# Patient Record
Sex: Male | Born: 1970 | Race: White | Hispanic: No | Marital: Single | Smoking: Never smoker
Health system: Southern US, Community
[De-identification: ages and names within clinical notes are randomized; demographics above are authoritative.]

## PROBLEM LIST (undated history)

## (undated) DIAGNOSIS — J45909 Unspecified asthma, uncomplicated: Secondary | ICD-10-CM

## (undated) DIAGNOSIS — G4700 Insomnia, unspecified: Secondary | ICD-10-CM

## (undated) DIAGNOSIS — M25561 Pain in right knee: Secondary | ICD-10-CM

## (undated) DIAGNOSIS — R221 Localized swelling, mass and lump, neck: Secondary | ICD-10-CM

## (undated) DIAGNOSIS — F319 Bipolar disorder, unspecified: Secondary | ICD-10-CM

## (undated) HISTORY — DX: Bipolar disorder, unspecified: F31.9

## (undated) HISTORY — DX: Insomnia, unspecified: G47.00

## (undated) HISTORY — DX: Localized swelling, mass and lump, neck: R22.1

## (undated) HISTORY — PX: KNEE SURGERY: SHX244

## (undated) HISTORY — DX: Pain in right knee: M25.561

---
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
## 1995-07-15 HISTORY — PX: KNEE ARTHROSCOPY: SUR90

## 2010-07-14 DIAGNOSIS — R3129 Other microscopic hematuria: Secondary | ICD-10-CM

## 2010-07-14 DIAGNOSIS — G8929 Other chronic pain: Secondary | ICD-10-CM

## 2010-07-14 HISTORY — DX: Other microscopic hematuria: R31.29

## 2010-07-14 HISTORY — DX: Other chronic pain: G89.29

## 2012-01-12 DIAGNOSIS — S2239XA Fracture of one rib, unspecified side, initial encounter for closed fracture: Secondary | ICD-10-CM

## 2012-01-12 HISTORY — DX: Fracture of one rib, unspecified side, initial encounter for closed fracture: S22.39XA

## 2012-01-28 ENCOUNTER — Encounter (HOSPITAL_COMMUNITY): Payer: Self-pay | Admitting: *Deleted

## 2012-01-28 ENCOUNTER — Emergency Department (HOSPITAL_COMMUNITY): Payer: Commercial Managed Care - PPO

## 2012-01-28 ENCOUNTER — Emergency Department (HOSPITAL_COMMUNITY)
Admission: EM | Admit: 2012-01-28 | Discharge: 2012-01-28 | Disposition: A | Discharge status: Home / Self Care | Payer: Commercial Managed Care - PPO | Attending: Emergency Medicine | Admitting: Emergency Medicine

## 2012-01-28 DIAGNOSIS — S2249XA Multiple fractures of ribs, unspecified side, initial encounter for closed fracture: Secondary | ICD-10-CM | POA: Insufficient documentation

## 2012-01-28 DIAGNOSIS — S20219A Contusion of unspecified front wall of thorax, initial encounter: Secondary | ICD-10-CM | POA: Insufficient documentation

## 2012-01-28 DIAGNOSIS — W1801XA Striking against sports equipment with subsequent fall, initial encounter: Secondary | ICD-10-CM | POA: Insufficient documentation

## 2012-01-28 DIAGNOSIS — Y9366 Activity, soccer: Secondary | ICD-10-CM | POA: Insufficient documentation

## 2012-01-28 DIAGNOSIS — S2239XA Fracture of one rib, unspecified side, initial encounter for closed fracture: Secondary | ICD-10-CM

## 2012-01-28 HISTORY — DX: Unspecified asthma, uncomplicated: J45.909

## 2012-01-28 MED ORDER — HYDROCODONE-ACETAMINOPHEN 5-500 MG PO TABS: 1.0000 | ORAL_TABLET | Freq: Four times a day (QID) | ORAL | Status: AC | PRN

## 2012-01-28 MED ORDER — HYDROCODONE-ACETAMINOPHEN 5-325 MG PO TABS: 2.0000 | ORAL_TABLET | Freq: Once | ORAL | Status: DC

## 2012-01-28 MED ORDER — ONDANSETRON HCL 4 MG PO TABS: 4.0000 mg | ORAL_TABLET | Freq: Four times a day (QID) | ORAL | Status: AC

## 2012-01-28 MED ORDER — CYCLOBENZAPRINE HCL 10 MG PO TABS: 10.0000 mg | ORAL_TABLET | Freq: Two times a day (BID) | ORAL | Status: AC | PRN

## 2012-01-28 MED ORDER — IBUPROFEN 800 MG PO TABS: 800.0000 mg | ORAL_TABLET | Freq: Three times a day (TID) | ORAL | Status: AC

## 2012-01-28 NOTE — ED Notes (Signed)
Pt states he was hit in the left ribs on Monday playing soccer. Pt has pain that varies sometimes sharp and moves

## 2012-01-28 NOTE — ED Notes (Signed)
Pt elects not to take meds for discomfort, states the took Tramadol prior to coming to the ED

## 2012-01-28 NOTE — ED Provider Notes (Signed)
History     CSN: [id number]  Arrival date & time 01/28/12  0936   First MD Initiated Contact with Patient 01/28/12 [id number]      No chief complaint on file.   (Consider location/radiation/quality/duration/timing/severity/associated sxs/prior treatment) HPI  Patient presents to emergency department complaining of left chest wall/rib injury that happened 2 days ago while playing soccer. Patient states that he was running on the field collision with an opponent the opponent striking into his left chest wall. Patient states he fell to the ground immediately had some pain in which the sideline rested for a few minutes. However he states he was able to go back into the game and finish area and patient states that he woke the next day, yesterday, with more severe pain in his been having severe pain since then. Patient taken ibuprofen and Ultram with little to no relief of pain. He states pain is aggravated by movement, cough, deep breathing or even touch. He denies any difficulty breathing stating "justified take a really big breath it hurts." He denies any abdominal pain, hitting head or loss of consciousness, neck pain or back pain, or any other additional injury.  No past medical history on file.  No past surgical history on file.  No family history on file.  History  Substance Use Topics  . Smoking status: Not on file  . Smokeless tobacco: Not on file  . Alcohol Use: Not on file      Review of Systems  All other systems reviewed and are negative.    Allergies  Review of patient's allergies indicates no known allergies.  Home Medications   Current Outpatient Rx  Name Route Sig Dispense Refill  . BUPROPION HCL ER (SR) 100 MG PO TB12 Oral Take 200 mg by mouth every morning.    . IBUPROFEN 200 MG PO TABS Oral Take 200 mg by mouth every 6 (six) hours as needed. pain    . LAMOTRIGINE 200 MG PO TABS Oral Take 200 mg by mouth daily.    . TRAMADOL HCL 50 MG PO TABS Oral Take 50 mg by  mouth every 6 (six) hours as needed. pain      There were no vitals taken for this visit.  Physical Exam  Nursing note and vitals reviewed. Constitutional: He is oriented to person, place, and time. He appears well-developed and well-nourished. No distress.  HENT:  Head: Normocephalic and atraumatic.  Eyes: Conjunctivae are normal.  Neck: Normal range of motion. Neck supple.  Cardiovascular: Normal rate, regular rhythm, normal heart sounds and intact distal pulses.  Exam reveals no gallop and no friction rub.   No murmur heard. Pulmonary/Chest: Effort normal and breath sounds normal. No respiratory distress. He has no wheezes. He has no rales. He exhibits tenderness.       Tenderness to palpation of left lower anterior ribs without chest wall bruising, crepitus, or step off. Pain in left lateral lower ribs with deep inhalation however breath sounds equal throughout all lung fields bilaterally.  Abdominal: Soft. Bowel sounds are normal. He exhibits no distension and no mass. There is no tenderness. There is no rebound and no guarding.  Musculoskeletal: Normal range of motion.  Neurological: He is alert and oriented to person, place, and time.  Skin: Skin is warm and dry. No rash noted. He is not diaphoretic. No erythema.  Psychiatric: He has a normal mood and affect.    ED Course  Procedures (including critical care time)  By mouth hydrocodone acetaminophen.  Patient is made aware of importance of incentive spirometry but due to financial reasons does not want to be charged for actual device however states he will actively take deep breathing as instructed by nursing.   Labs Reviewed - No data to display Dg Ribs Unilateral W/chest Left  01/28/2012  *RADIOLOGY REPORT*  Clinical Data: Injured playing soccer with trauma to the chest  LEFT RIBS AND CHEST - 3+ VIEW  Comparison: None.  Findings: The lungs appear clear.  No pneumothorax is noted. Mediastinal contours are normal.  The heart is  within normal limits in size.  Left rib detail films do show fractures of the left lateral eighth rib and anterior left tenth rib.  IMPRESSION:    Fractures of the left lateral eighth and anterior left tenth rib. No pneumothorax.  Original Report Authenticated By: Juline Patch, M.D.     1. Rib fractures   2. Chest wall contusion       MDM  Non displaced rib fxs of 8th and 10th ribs however abdomen completely nontender, noting no TTP over LUQ of abdomen. VSS. Patient ambulating without difficulty. Good lung exam and patient able to take deep breaths.         [place], [place] 01/28/12 1112

## 2012-01-29 NOTE — ED Provider Notes (Signed)
Medical screening examination/treatment/procedure(s) were performed by non-physician practitioner and as supervising physician I was immediately available for consultation/collaboration.   Teofil Maniaci R Jakwan Sally, MD 01/29/12 0750 

## 2017-04-13 HISTORY — PX: OTHER SURGICAL HISTORY: SHX169

## 2017-07-14 HISTORY — PX: OTHER SURGICAL HISTORY: SHX169

## 2018-06-14 ENCOUNTER — Other Ambulatory Visit (HOSPITAL_COMMUNITY): Payer: Self-pay | Admitting: Neurosurgery

## 2018-06-14 ENCOUNTER — Ambulatory Visit (HOSPITAL_COMMUNITY)
Admission: RE | Admit: 2018-06-14 | Discharge: 2018-06-14 | Disposition: A | Discharge status: Home / Self Care | Payer: Managed Care, Other (non HMO) | Source: Ambulatory Visit | Attending: Neurosurgery | Admitting: Neurosurgery

## 2018-06-14 DIAGNOSIS — M4316 Spondylolisthesis, lumbar region: Secondary | ICD-10-CM | POA: Insufficient documentation

## 2018-06-14 DIAGNOSIS — M5416 Radiculopathy, lumbar region: Secondary | ICD-10-CM | POA: Insufficient documentation

## 2018-06-14 DIAGNOSIS — M5126 Other intervertebral disc displacement, lumbar region: Secondary | ICD-10-CM | POA: Insufficient documentation

## 2018-06-14 MED ORDER — GADOBUTROL 1 MMOL/ML IV SOLN
7.0000 mL | Freq: Once | INTRAVENOUS | Status: AC | PRN
  Administered 2018-06-14: 7 mL via INTRAVENOUS

## 2019-05-05 DIAGNOSIS — L821 Other seborrheic keratosis: Secondary | ICD-10-CM | POA: Diagnosis not present

## 2019-05-05 DIAGNOSIS — L718 Other rosacea: Secondary | ICD-10-CM | POA: Diagnosis not present

## 2019-05-05 DIAGNOSIS — D225 Melanocytic nevi of trunk: Secondary | ICD-10-CM | POA: Diagnosis not present

## 2019-05-05 DIAGNOSIS — L573 Poikiloderma of Civatte: Secondary | ICD-10-CM | POA: Diagnosis not present

## 2019-09-22 DIAGNOSIS — Z20822 Contact with and (suspected) exposure to covid-19: Secondary | ICD-10-CM | POA: Diagnosis not present

## 2019-11-24 DIAGNOSIS — Z0000 Encounter for general adult medical examination without abnormal findings: Secondary | ICD-10-CM | POA: Diagnosis not present

## 2019-11-24 DIAGNOSIS — Z125 Encounter for screening for malignant neoplasm of prostate: Secondary | ICD-10-CM | POA: Diagnosis not present

## 2019-11-24 DIAGNOSIS — Z79899 Other long term (current) drug therapy: Secondary | ICD-10-CM | POA: Diagnosis not present

## 2019-11-25 DIAGNOSIS — F319 Bipolar disorder, unspecified: Secondary | ICD-10-CM | POA: Diagnosis not present

## 2019-11-25 DIAGNOSIS — Z0000 Encounter for general adult medical examination without abnormal findings: Secondary | ICD-10-CM | POA: Diagnosis not present

## 2019-11-25 DIAGNOSIS — I10 Essential (primary) hypertension: Secondary | ICD-10-CM | POA: Diagnosis not present

## 2019-11-25 DIAGNOSIS — M545 Low back pain: Secondary | ICD-10-CM | POA: Diagnosis not present

## 2019-12-05 DIAGNOSIS — F331 Major depressive disorder, recurrent, moderate: Secondary | ICD-10-CM | POA: Diagnosis not present

## 2020-01-03 DIAGNOSIS — F331 Major depressive disorder, recurrent, moderate: Secondary | ICD-10-CM | POA: Diagnosis not present

## 2020-02-08 DIAGNOSIS — F331 Major depressive disorder, recurrent, moderate: Secondary | ICD-10-CM | POA: Diagnosis not present

## 2020-02-22 DIAGNOSIS — F331 Major depressive disorder, recurrent, moderate: Secondary | ICD-10-CM | POA: Diagnosis not present

## 2020-03-07 DIAGNOSIS — F331 Major depressive disorder, recurrent, moderate: Secondary | ICD-10-CM | POA: Diagnosis not present

## 2020-04-04 DIAGNOSIS — F331 Major depressive disorder, recurrent, moderate: Secondary | ICD-10-CM | POA: Diagnosis not present

## 2020-05-17 DIAGNOSIS — F331 Major depressive disorder, recurrent, moderate: Secondary | ICD-10-CM | POA: Diagnosis not present

## 2020-06-05 DIAGNOSIS — F331 Major depressive disorder, recurrent, moderate: Secondary | ICD-10-CM | POA: Diagnosis not present

## 2020-08-10 DIAGNOSIS — G518 Other disorders of facial nerve: Secondary | ICD-10-CM | POA: Diagnosis not present

## 2020-12-20 DIAGNOSIS — F4323 Adjustment disorder with mixed anxiety and depressed mood: Secondary | ICD-10-CM | POA: Diagnosis not present

## 2021-01-07 DIAGNOSIS — L573 Poikiloderma of Civatte: Secondary | ICD-10-CM | POA: Diagnosis not present

## 2021-01-07 DIAGNOSIS — D225 Melanocytic nevi of trunk: Secondary | ICD-10-CM | POA: Diagnosis not present

## 2021-01-07 DIAGNOSIS — L821 Other seborrheic keratosis: Secondary | ICD-10-CM | POA: Diagnosis not present

## 2021-01-07 DIAGNOSIS — D1801 Hemangioma of skin and subcutaneous tissue: Secondary | ICD-10-CM | POA: Diagnosis not present

## 2021-01-10 DIAGNOSIS — F4323 Adjustment disorder with mixed anxiety and depressed mood: Secondary | ICD-10-CM | POA: Diagnosis not present

## 2021-01-16 DIAGNOSIS — F4323 Adjustment disorder with mixed anxiety and depressed mood: Secondary | ICD-10-CM | POA: Diagnosis not present

## 2021-02-01 DIAGNOSIS — F4323 Adjustment disorder with mixed anxiety and depressed mood: Secondary | ICD-10-CM | POA: Diagnosis not present

## 2021-03-27 DIAGNOSIS — F4323 Adjustment disorder with mixed anxiety and depressed mood: Secondary | ICD-10-CM | POA: Diagnosis not present

## 2021-05-20 DIAGNOSIS — F4323 Adjustment disorder with mixed anxiety and depressed mood: Secondary | ICD-10-CM | POA: Diagnosis not present

## 2021-06-11 DIAGNOSIS — F4323 Adjustment disorder with mixed anxiety and depressed mood: Secondary | ICD-10-CM | POA: Diagnosis not present

## 2021-06-18 DIAGNOSIS — Z008 Encounter for other general examination: Secondary | ICD-10-CM | POA: Diagnosis not present

## 2021-10-21 ENCOUNTER — Ambulatory Visit (HOSPITAL_COMMUNITY)
Admission: RE | Admit: 2021-10-21 | Discharge: 2021-10-21 | Disposition: A | Discharge status: Home / Self Care | Payer: BC Managed Care – PPO | Source: Ambulatory Visit | Attending: Internal Medicine | Admitting: Internal Medicine

## 2021-10-21 ENCOUNTER — Other Ambulatory Visit (HOSPITAL_COMMUNITY): Payer: Self-pay | Admitting: Internal Medicine

## 2021-10-21 ENCOUNTER — Other Ambulatory Visit: Payer: Self-pay | Admitting: Internal Medicine

## 2021-10-21 DIAGNOSIS — G518 Other disorders of facial nerve: Secondary | ICD-10-CM | POA: Insufficient documentation

## 2021-10-21 DIAGNOSIS — G500 Trigeminal neuralgia: Secondary | ICD-10-CM | POA: Diagnosis not present

## 2021-10-21 DIAGNOSIS — G519 Disorder of facial nerve, unspecified: Secondary | ICD-10-CM | POA: Diagnosis not present

## 2021-10-21 MED ORDER — GADOBUTROL 1 MMOL/ML IV SOLN
7.5000 mL | Freq: Once | INTRAVENOUS | Status: AC | PRN
  Administered 2021-10-21: 7.5 mL via INTRAVENOUS

## 2021-11-28 ENCOUNTER — Encounter: Payer: Self-pay | Admitting: *Deleted

## 2021-11-28 NOTE — Progress Notes (Signed)
Notes used for referral: Jorge Orozco at [facility].

## 2021-12-02 ENCOUNTER — Ambulatory Visit: Payer: BC Managed Care – PPO | Admitting: Neurology

## 2021-12-12 DIAGNOSIS — L718 Other rosacea: Secondary | ICD-10-CM | POA: Diagnosis not present

## 2021-12-17 ENCOUNTER — Ambulatory Visit: Payer: BC Managed Care – PPO | Admitting: Neurology

## 2021-12-18 ENCOUNTER — Encounter: Payer: Self-pay | Admitting: Neurology

## 2022-07-16 DIAGNOSIS — F331 Major depressive disorder, recurrent, moderate: Secondary | ICD-10-CM | POA: Diagnosis not present

## 2022-07-16 DIAGNOSIS — F411 Generalized anxiety disorder: Secondary | ICD-10-CM | POA: Diagnosis not present

## 2022-08-21 DIAGNOSIS — F411 Generalized anxiety disorder: Secondary | ICD-10-CM | POA: Diagnosis not present

## 2022-08-21 DIAGNOSIS — F331 Major depressive disorder, recurrent, moderate: Secondary | ICD-10-CM | POA: Diagnosis not present

## 2022-12-17 DIAGNOSIS — F319 Bipolar disorder, unspecified: Secondary | ICD-10-CM | POA: Diagnosis not present

## 2022-12-17 DIAGNOSIS — F411 Generalized anxiety disorder: Secondary | ICD-10-CM | POA: Diagnosis not present

## 2022-12-17 DIAGNOSIS — F332 Major depressive disorder, recurrent severe without psychotic features: Secondary | ICD-10-CM | POA: Diagnosis not present

## 2022-12-26 DIAGNOSIS — J029 Acute pharyngitis, unspecified: Secondary | ICD-10-CM | POA: Diagnosis not present

## 2023-03-09 DIAGNOSIS — F332 Major depressive disorder, recurrent severe without psychotic features: Secondary | ICD-10-CM | POA: Diagnosis not present

## 2023-04-07 DIAGNOSIS — F319 Bipolar disorder, unspecified: Secondary | ICD-10-CM | POA: Diagnosis not present

## 2023-07-10 ENCOUNTER — Encounter: Payer: Self-pay | Admitting: Behavioral Health

## 2023-07-10 ENCOUNTER — Ambulatory Visit (INDEPENDENT_AMBULATORY_CARE_PROVIDER_SITE_OTHER): Payer: 59 | Admitting: Behavioral Health

## 2023-07-10 ENCOUNTER — Telehealth: Payer: Self-pay | Admitting: Behavioral Health

## 2023-07-10 VITALS — BP 143/94 | HR 82 | Ht 67.5 in | Wt 164.0 lb

## 2023-07-10 DIAGNOSIS — F330 Major depressive disorder, recurrent, mild: Secondary | ICD-10-CM | POA: Diagnosis not present

## 2023-07-10 DIAGNOSIS — F411 Generalized anxiety disorder: Secondary | ICD-10-CM | POA: Diagnosis not present

## 2023-07-10 DIAGNOSIS — Z9151 Personal history of suicidal behavior: Secondary | ICD-10-CM | POA: Diagnosis not present

## 2023-07-10 DIAGNOSIS — F39 Unspecified mood [affective] disorder: Secondary | ICD-10-CM | POA: Diagnosis not present

## 2023-07-10 MED ORDER — DULOXETINE HCL 40 MG PO CPEP: 40.0000 mg | ORAL_CAPSULE | Freq: Every day | ORAL | 2 refills | Status: DC

## 2023-07-10 MED ORDER — LAMOTRIGINE 200 MG PO TABS: 200.0000 mg | ORAL_TABLET | Freq: Every day | ORAL | 3 refills | Status: DC

## 2023-07-10 NOTE — Progress Notes (Signed)
[facility] MD/PA/NP Initial Note  07/10/2023 12:27 PM Jorge Orozco  MRN:  [id number]  Chief Complaint:  Chief Complaint   Anxiety; Depression; Establish Care; Patient Education; Medication Refill     HPI:   "Jorge Orozco", 52 year old patient presents to this office for initial visit and to establish care.  Collateral information should be considered reliable.  Patient states that he is struggled with anxiety and depression since he was in his late 6s.  During this initial time.  The provider who he cannot remember her name diagnosed him with bipolar.  He has never been assured that this is an accurate diagnosis because he cannot recall any past periods of mania or hypomania.  Prior to this appointment he was seeing Dr. Elsie Saas, psychiatrist.  Says that he was placed on Cymbalta in late summer.  He continues with Lamictal.  Says that this combination so far has been working well for him and that he is experiencing low levels of anxiety and depression.  He is here today to get a different opinion on his condition.  His PHQ-9 was negative.  His MDQ was grossly negative with none of the criteria on marked yes.  He numerically rates his depression at 0/10, and anxiety at 3/10.  He says that he is sleeping 7 to 8 hours per night.  He does endorse daily EtOH intake average of 2-3 alcoholic beverages per day.  For now he is requesting no medication changes but needs refills.  He does have a past history of 1 suicide attempt approximately 1 year ago.  Says that he combined heavy EtOH along with about 15 Xanax.  Says that he never went to the ER but did seek out psychotherapy.  He is currently seeing Wenda Overland for psychotherapy.  He denies any history of mania, no psychosis, no auditory or visual hallucinations.  Denies any current SI or HI.  Patient states that he feels safe and verbally contracted for safety with this Clinical research associate.  Past psychiatric medication trials: Zoloft Lexapro Trintellix- Did not  like the way it made him feel Seroquel Klonopin Xanax Gabapentin Ambien Lunesta Lamictal Wellbutrin Cymbalta- current    Visit Diagnosis:    ICD-10-CM   1. Mild episode of recurrent major depressive disorder (HCC)  F33.0 DULoxetine HCl 40 MG CPEP    lamoTRIgine (LAMICTAL) 200 MG tablet    2. Generalized anxiety disorder  F41.1 DULoxetine HCl 40 MG CPEP    3. Unspecified mood (affective) disorder (HCC)  F39 lamoTRIgine (LAMICTAL) 200 MG tablet    4. Hx of suicide attempt  Z91.51       Past Psychiatric History: Anxiety, MDD, Bipolar  Past Medical History:  Past Medical History:  Diagnosis Date   Asthma    Bipolar disorder (HCC)    Chronic knee pain 2012   right; baker's cyst   Insomnia    hard to get back asleep, sleep test in 2012 reportedly normal   Lump on neck    resolved   Microscopic hematuria 2012   workup suggested a passed kidney stone   Rib fracture 01/2012   July 2013 left 8th and 10th rib fractures from soccer accident, no penumothorax   Right knee pain    mild pain with exercise    Past Surgical History:  Procedure Laterality Date   KNEE ARTHROSCOPY Left 1997   KNEE SURGERY     lumbar 4-5 disc surgery  04/2017   Dr Venetia Maxon   lumbar spine ESI  2019   skull  surgery  1971    Family Psychiatric History: see chart  Family History:  Family History  Problem Relation Age of Onset   Depression Mother    Anxiety disorder Mother    Hypertension Mother    Sleep apnea Mother    Leukemia Father    CAD Father    Prostate cancer Father    Anxiety disorder Sister    Alcohol abuse Maternal Grandfather    Bipolar disorder Maternal Grandmother    Alcohol abuse Maternal Grandmother     Social History:  Social History   Socioeconomic History   Marital status: Single    Spouse name: Not on file   Number of children: 0   Years of education: 16   Highest education level: Bachelor's degree (e.g., BA, AB, BS)  Occupational History   Occupation: [employer]  Tobacco Use   Smoking status: Never   Smokeless tobacco: Not on file  Vaping Use   Vaping status: Never Used  Substance and Sexual Activity   Alcohol use: Yes    Comment: Two drinks per day, glass of wine or coctail   Drug use: No   Sexual activity: Yes  Other Topics Concern   Not on file  Social History Narrative   Living in [place] alone. Enjoys fishing, and boating in free time.    Social Drivers of Corporate investment banker Strain: Not on file  Food Insecurity: Not on file  Transportation Needs: Not on file  Physical Activity: Not on file  Stress: Not on file  Social Connections: Not on file    Allergies: No Known Allergies  Metabolic Disorder Labs: No results found for: "HGBA1C", "MPG" No results found for: "PROLACTIN" No results found for: "CHOL", "TRIG", "HDL", "CHOLHDL", "VLDL", "LDLCALC" No results found for: "TSH"  Therapeutic Level Labs: No results found for: "LITHIUM" No results found for: "VALPROATE" No results found for: "CBMZ"  Current Medications: Current Outpatient Medications  Medication Sig Dispense Refill   DULoxetine HCl 40 MG CPEP Take 1 capsule (40 mg total) by mouth daily after breakfast. 30 capsule 2   buPROPion (WELLBUTRIN XL) 300 MG 24 hr tablet Take 300 mg by mouth daily. (Patient not taking: Reported on 07/10/2023)     lamoTRIgine (LAMICTAL) 200 MG tablet Take 1 tablet (200 mg total) by mouth daily. 30 tablet 3   traMADol (ULTRAM) 50 MG tablet Take 50 mg by mouth every 6 (six) hours as needed. pain     No current facility-administered medications for this visit.    Medication Side Effects: none  Orders placed this visit:  No orders of the defined types were placed in this encounter.   Psychiatric Specialty Exam:  Review of Systems  Constitutional: Negative.   Allergic/Immunologic: Negative.   Neurological: Negative.   Psychiatric/Behavioral: Negative.      Blood pressure (!) 143/94,  pulse 82, height 5' 7.5" (1.715 m), weight 164 lb (74.4 kg).Body mass index is 25.31 kg/m.  General Appearance: Casual, Neat, and Well Groomed  Eye Contact:  Good  Speech:  Clear and Coherent  Volume:  Normal  Mood:  NA  Affect:  Appropriate  Thought Process:  Coherent  Orientation:  Full (Time, Place, and Person)  Thought Content: Logical   Suicidal Thoughts:  No  Homicidal Thoughts:  No  Memory:  WNL  Judgement:  Good  Insight:  Good  Psychomotor Activity:  Normal  Concentration:  Concentration: Good  Recall:  Good  Fund  of Knowledge: Good  Language: Good  Assets:  Desire for Improvement  ADL's:  Intact  Cognition: WNL  Prognosis:  Good   Screenings:  PHQ2-9    Flowsheet Row Office Visit from 07/10/2023 in [facility]  PHQ-2 Total Score 1       Receiving Psychotherapy: No   Treatment Plan/Recommendations:   Greater than 50% of  60 min face to face time with patient was spent on counseling and coordination of care. We discussed his long history with anxiety and depression stemming back to his late 72s.  We talked about previous care with other providers over the years and plan of care.  Reviewed prior psychotropic medication trials and current stability.  He is currently stable and feels like that he is in a good place.  Requesting no medication changes this visit.  We agreed today to: Will continue Cymbalta 40 mg daily after breakfast To continue Lamictal 200 mg daily To follow-up in 6 weeks to reassess via video appointment Will report worsening symptoms or side effects promptly Provided emergency contact information Reviewed PDMP      Joan Flores, NP

## 2023-07-20 MED ORDER — DULOXETINE HCL 20 MG PO CPEP: 40.0000 mg | ORAL_CAPSULE | Freq: Every day | ORAL | 2 refills | Status: DC

## 2023-07-20 NOTE — Telephone Encounter (Signed)
 Jorge Orozco called at 9:37 to check status of authorizing his Cymbalta.  He is completely out. He doesn't understand why insurance won't cover this medication.  It always has.  He is new to [place] and probably just needs a new PA.

## 2023-07-20 NOTE — Telephone Encounter (Signed)
 LVM to [facility]

## 2023-07-20 NOTE — Telephone Encounter (Signed)
 Arlys John sent in 20 mg x 2. LVM to [facility] to see how patient was taking previously.

## 2023-07-20 NOTE — Telephone Encounter (Signed)
 Rx has been filled for 20 mg x 2.

## 2023-08-12 ENCOUNTER — Other Ambulatory Visit: Payer: Self-pay | Admitting: Behavioral Health

## 2023-08-12 DIAGNOSIS — F39 Unspecified mood [affective] disorder: Secondary | ICD-10-CM

## 2023-08-12 DIAGNOSIS — F411 Generalized anxiety disorder: Secondary | ICD-10-CM

## 2023-08-12 DIAGNOSIS — F330 Major depressive disorder, recurrent, mild: Secondary | ICD-10-CM

## 2023-08-25 ENCOUNTER — Telehealth (INDEPENDENT_AMBULATORY_CARE_PROVIDER_SITE_OTHER): Payer: 59 | Admitting: Behavioral Health

## 2023-08-25 ENCOUNTER — Encounter: Payer: Self-pay | Admitting: Behavioral Health

## 2023-08-25 DIAGNOSIS — F39 Unspecified mood [affective] disorder: Secondary | ICD-10-CM | POA: Diagnosis not present

## 2023-08-25 DIAGNOSIS — F330 Major depressive disorder, recurrent, mild: Secondary | ICD-10-CM | POA: Diagnosis not present

## 2023-08-25 DIAGNOSIS — F411 Generalized anxiety disorder: Secondary | ICD-10-CM | POA: Diagnosis not present

## 2023-08-25 NOTE — Progress Notes (Signed)
MARSALIS Orozco [id number] 16-Dec-1970 53 y.o.  Virtual Visit via Video Note  I connected with pt @ on 08/25/23 at  1:30 PM EST by a video enabled telemedicine application and verified that I am speaking with the correct person using two identifiers.   I discussed the limitations of evaluation and management by telemedicine and the availability of in person appointments. The patient expressed understanding and agreed to proceed.  I discussed the assessment and treatment plan with the patient. The patient was provided an opportunity to ask questions and all were answered. The patient agreed with the plan and demonstrated an understanding of the instructions.   The patient was advised to call back or seek an in-person evaluation if the symptoms worsen or if the condition fails to improve as anticipated.  I provided 20 minutes of non-face-to-face time during this encounter.  The patient was located at home.  The provider was located at [facility].   Joan Flores, NP   Subjective:   Patient ID:  ROSALIE Orozco is a 53 y.o. (DOB 06/22/1971) male.  Chief Complaint:  Chief Complaint  Patient presents with   Anxiety   Follow-up   Patient Education   Medication Refill    HPI  "Jorge Orozco", 53 year old patient presents to this office for follow up and medication management. Collateral information should be considered reliable. Says he is doing very good right now.   He numerically rates his depression at 0/10, and anxiety at 3/10.  He says that he is sleeping 7 to 8 hours per night.  Says that he has been doing very well since last visit. 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  His drinking has been more controlled.  He does not want to make any adjustments to his medication this visit.   He is currently seeing Wenda Overland for psychotherapy.  He denies any history of mania, no psychosis, no auditory or visual hallucinations.  Denies any current SI or HI.   Patient states that he feels safe and verbally contracted for safety with this Clinical research associate.   Past psychiatric medication trials: Zoloft Lexapro Trintellix- Did not like the way it made him feel Seroquel Klonopin Xanax Gabapentin Ambien Lunesta Lamictal Wellbutrin Cymbalta- current    Review of Systems:  Review of Systems  Constitutional: Negative.   Neurological: Negative.   Psychiatric/Behavioral: Negative.      Medications: I have reviewed the patient's current medications.  Current Outpatient Medications  Medication Sig Dispense Refill   DULoxetine (CYMBALTA) 20 MG capsule Take 2 capsules (40 mg total) by mouth daily. 60 capsule 2   lamoTRIgine (LAMICTAL) 200 MG tablet Take 1 tablet (200 mg total) by mouth daily. 30 tablet 3   No current facility-administered medications for this visit.    Medication Side Effects: None  Allergies: No Known Allergies  Past Medical History:  Diagnosis Date   Asthma    Bipolar disorder (HCC)    Chronic knee pain 2012   right; baker's cyst   Insomnia    hard to get back asleep, sleep test in 2012 reportedly normal   Lump on neck    resolved   Microscopic hematuria 2012   workup suggested a passed kidney stone   Rib fracture 01/2012   July 2013 left 8th and 10th rib fractures from soccer accident, no penumothorax   Right knee pain    mild pain with exercise    Family History  Problem Relation Age of  Onset   Depression Mother    Anxiety disorder Mother    Hypertension Mother    Sleep apnea Mother    Leukemia Father    CAD Father    Prostate cancer Father    Anxiety disorder Sister    Alcohol abuse Maternal Grandfather    Bipolar disorder Maternal Grandmother    Alcohol abuse Maternal Grandmother     Social History   Socioeconomic History   Marital status: Single    Spouse name: Not on file   Number of children: 0   Years of education: 16   Highest education level: Bachelor's degree (e.g., BA, AB, BS)   Occupational History   Occupation: [employer]  Tobacco Use   Smoking status: Never   Smokeless tobacco: Not on file  Vaping Use   Vaping status: Never Used  Substance and Sexual Activity   Alcohol use: Yes    Comment: Two drinks per day, glass of wine or coctail   Drug use: No   Sexual activity: Yes  Other Topics Concern   Not on file  Social History Narrative   Living in [place] alone. Enjoys fishing, and boating in free time.    Social Drivers of Corporate investment banker Strain: Not on file  Food Insecurity: Not on file  Transportation Needs: Not on file  Physical Activity: Not on file  Stress: Not on file  Social Connections: Not on file  Intimate Partner Violence: Not on file    Past Medical History, Surgical history, Social history, and Family history were reviewed and updated as appropriate.   Please see review of systems for further details on the patient's review from today.   Objective:   Physical Exam:  There were no vitals taken for this visit.  Physical Exam Constitutional:      General: He is not in acute distress.    Appearance: Normal appearance.  Neurological:     Mental Status: He is alert and oriented to person, place, and time.     Gait: Gait normal.  Psychiatric:        Attention and Perception: Attention and perception normal. He does not perceive auditory or visual hallucinations.        Mood and Affect: Mood and affect normal. Mood is not anxious or depressed. Affect is not labile.        Speech: Speech normal.        Behavior: Behavior normal. Behavior is cooperative.        Thought Content: Thought content normal.        Cognition and Memory: Cognition and memory normal.        Judgment: Judgment normal.     Lab Review:  No results found for: "NA", "K", "CL", "CO2", "GLUCOSE", "BUN", "CREATININE", "CALCIUM", "PROT", "ALBUMIN", "AST", "ALT", "ALKPHOS", "BILITOT", "GFRNONAA", "GFRAA"  No results found  for: "WBC", "RBC", "HGB", "HCT", "PLT", "MCV", "MCH", "MCHC", "RDW", "LYMPHSABS", "MONOABS", "EOSABS", "BASOSABS"  No results found for: "POCLITH", "LITHIUM"   No results found for: "PHENYTOIN", "PHENOBARB", "VALPROATE", "CBMZ"   .res Assessment: Plan:    Greater than 50% of  30 min video visit time with patient was spent on counseling and coordination of care. Discussed his report of good stability right now. He is happy with his medications.   Requesting no medication changes this visit.   We agreed today to: Will continue Cymbalta 40 mg daily after breakfast To continue Lamictal 200 mg daily To follow-up in  8 weeks to reassess via video appointment Will report worsening symptoms or side effects promptly Provided emergency contact information Reviewed PDMP   Arlys John A. Tyqwan Pink, NP   Greggory Stallion "Jorge Orozco" was seen today for anxiety, follow-up, patient education and medication refill.  Diagnoses and all orders for this visit:  Generalized anxiety disorder  Mild episode of recurrent major depressive disorder (HCC)  Unspecified mood (affective) disorder (HCC)     Please see After Visit Summary for patient specific instructions.  No future appointments.  No orders of the defined types were placed in this encounter.     -------------------------------

## 2023-11-15 ENCOUNTER — Other Ambulatory Visit: Payer: Self-pay | Admitting: Behavioral Health

## 2023-11-15 DIAGNOSIS — F330 Major depressive disorder, recurrent, mild: Secondary | ICD-10-CM

## 2023-11-15 DIAGNOSIS — F39 Unspecified mood [affective] disorder: Secondary | ICD-10-CM

## 2023-11-16 NOTE — Telephone Encounter (Signed)
 Please call to schedule FU

## 2023-11-18 NOTE — Telephone Encounter (Signed)
 Called Pt. Had to leave VM to RTC for F/U apt.

## 2023-12-04 IMAGING — MR MR HEAD WO/W CM
15 series · 48 of 48 positions shown · IV contrast (gadavist)
Comparison: None.

CLINICAL DATA: Facial neuralgia.

EXAM:
MRI HEAD WITHOUT AND WITH CONTRAST
TECHNIQUE: Multiplanar, multiecho pulse sequences of the brain and surrounding
structures were obtained without and with intravenous contrast.
CONTRAST:  7.5mL GADAVIST GADOBUTROL 1 MMOL/ML IV SOLN

[Series 5: DWI · axial · 3.0mm · 1.36mm/px · z∈[-53,+88]mm · 6 of 96 slices shown (1 of 2)]
[im 1/96]
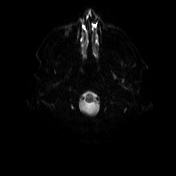
[im 20/96]
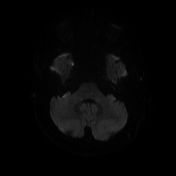
[im 39/96]
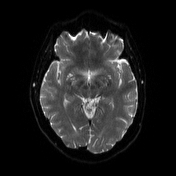
[im 58/96]
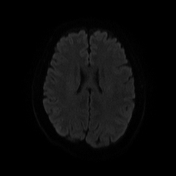
[im 77/96]
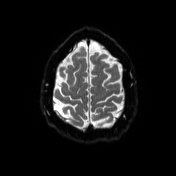
[im 96/96]
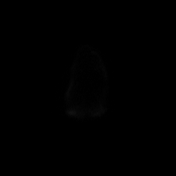

[Series 6: DWI · axial · 3.0mm · 1.36mm/px · z∈[-53,+88]mm · 3 of 48 slices shown (2 of 2)]
[im 1/48]
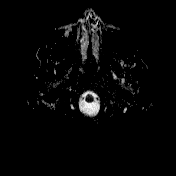
[im 24/48]
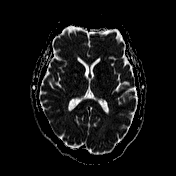
[im 48/48]
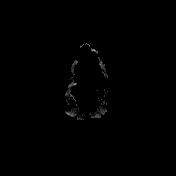

[Series 7: T1 · sagittal · 5.0mm · 0.75mm/px · 1 of 24 slices shown (1 of 4)]
[im 1/24]
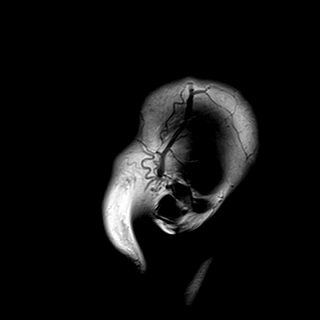

[Series 8: T2 · axial · 5.0mm · 0.62mm/px · 1 of 24 slices shown]
[im 1/24]
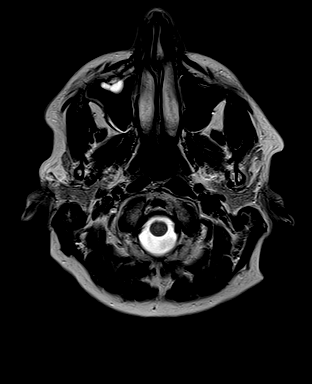

[Series 9: swi_images · axial · 3.0mm · 0.75mm/px · z∈[-59,+94]mm · 3 of 52 slices shown]
[im 1/52]
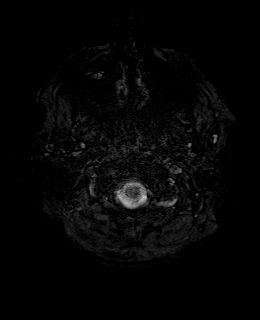
[im 26/52]
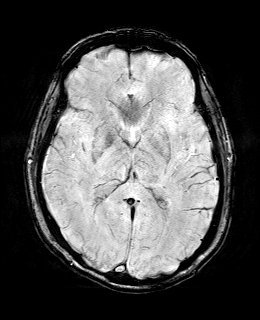
[im 52/52]
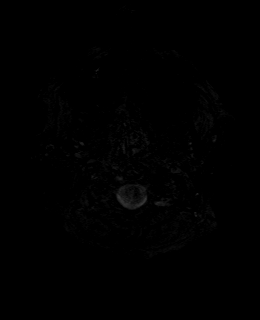

[Series 11: FLAIR · axial · 3.0mm · 0.75mm/px · z∈[-59,+94]mm · 3 of 52 slices shown]
[im 1/52]
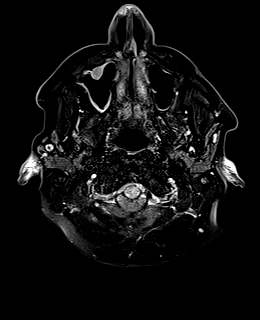
[im 26/52]
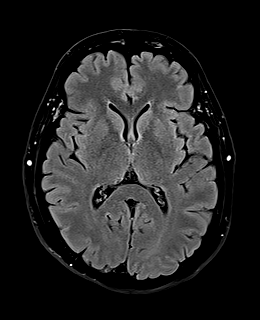
[im 52/52]
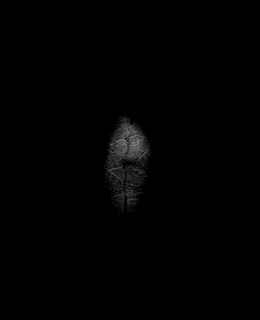

[Series 12: T1 · axial · 1.0mm · 0.94mm/px · z∈[-54,+89]mm · 8 of 144 slices shown (2 of 4)]
[im 1/144]
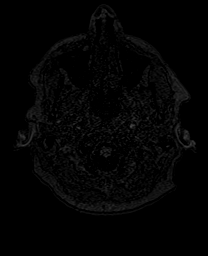
[im 21/144]
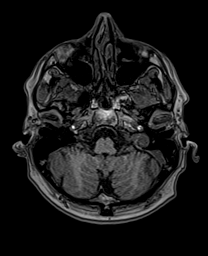
[im 41/144]
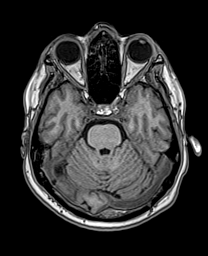
[im 62/144]
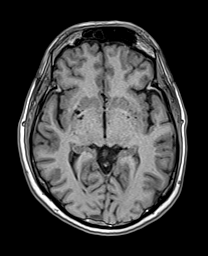
[im 82/144]
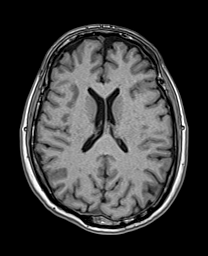
[im 103/144]
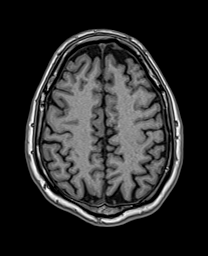
[im 123/144]
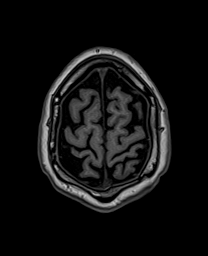
[im 144/144]
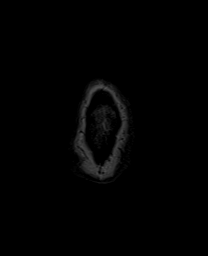

[Series 13: cor dwi_tracew · coronal · 5.0mm · 1.53mm/px · 4 of 60 slices shown]
[im 1/60]
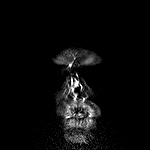
[im 20/60]
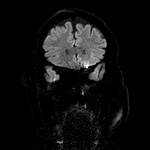
[im 40/60]
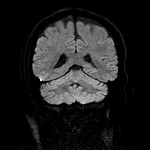
[im 60/60]
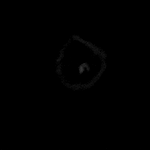

[Series 14: cor dwi_adc · coronal · 5.0mm · 1.53mm/px · 2 of 30 slices shown]
[im 1/30]
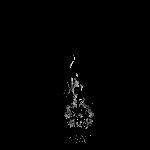
[im 30/30]
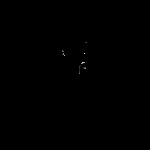

[Series 15: T2 post-contrast · coronal · 5.0mm · 0.57mm/px · 2 of 30 slices shown]
[im 1/30]
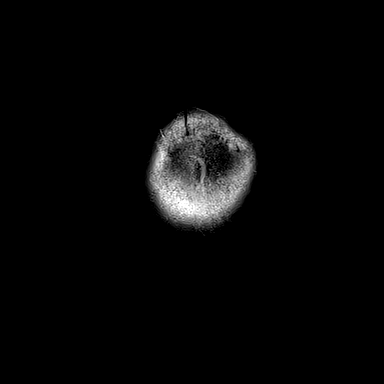
[im 30/30]
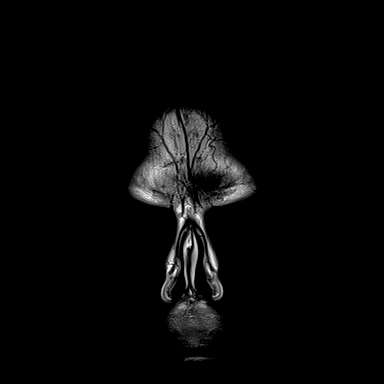

[Series 16: T1 post-contrast · axial · 1.0mm · 0.94mm/px · z∈[-54,+89]mm · 8 of 144 slices shown (1 of 3)]
[im 1/144]
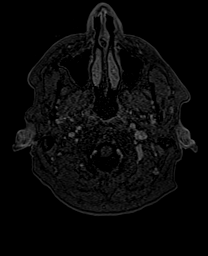
[im 21/144]
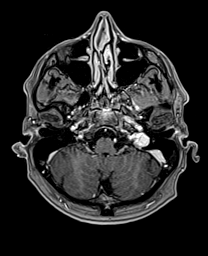
[im 41/144]
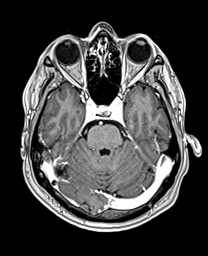
[im 62/144]
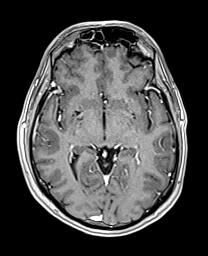
[im 82/144]
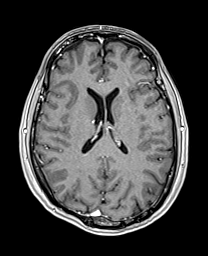
[im 103/144]
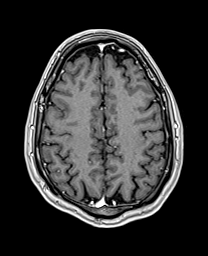
[im 123/144]
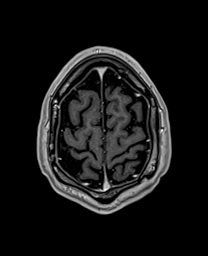
[im 144/144]
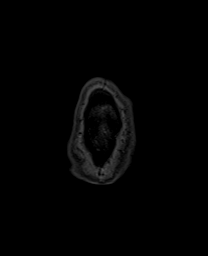

[Series 17: T1 · sagittal · 4.0mm · 0.94mm/px · 2 of 30 slices shown (3 of 4)]
[im 1/30]
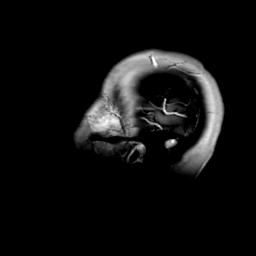
[im 30/30]
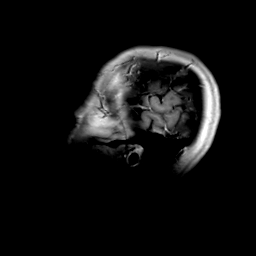

[Series 18: T1 · coronal · 4.0mm · 0.94mm/px · 2 of 30 slices shown (4 of 4)]
[im 1/30]
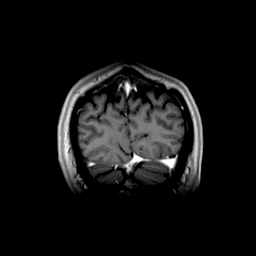
[im 30/30]
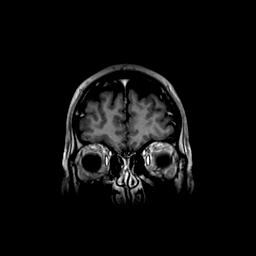

[Series 19: T1 post-contrast · coronal · 5.0mm · 0.43mm/px · 2 of 30 slices shown (2 of 3)]
[im 1/30]
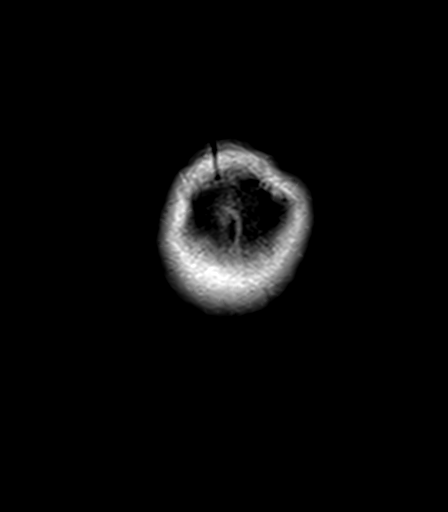
[im 30/30]
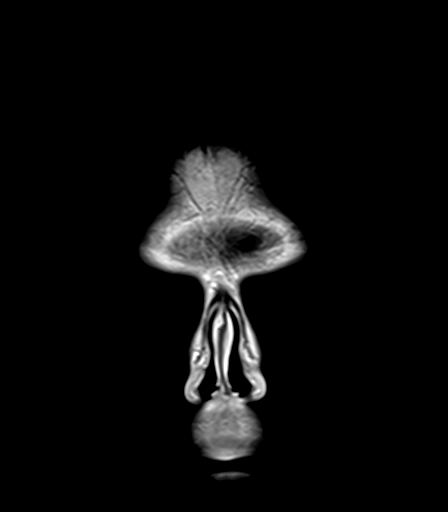

[Series 20: T1 post-contrast · sagittal · 5.0mm · 0.75mm/px · 1 of 24 slices shown (3 of 3)]
[im 1/24]
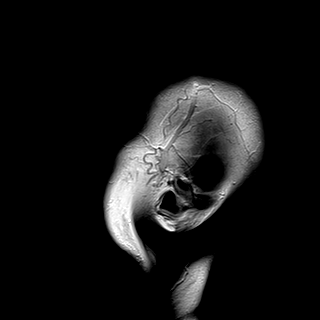

[48 of 48 positions shown; findings below may reference images not displayed]

FINDINGS: Brain: There is no evidence of an acute infarct, intracranial
hemorrhage, mass, midline shift, or extra-axial fluid collection.
The ventricles and sulci are normal. The brain is normal in signal.
No abnormal enhancement is identified.

Vascular: Major intracranial vascular flow voids are preserved.

Skull and upper cervical spine: Unremarkable bone marrow signal.

Sinuses/Orbits: Unremarkable orbits. Mild right ethmoid and
maxillary sinus mucosal thickening. Trace left mastoid fluid.

Other: None.
IMPRESSION: Unremarkable appearance of the brain.

## 2023-12-19 ENCOUNTER — Other Ambulatory Visit: Payer: Self-pay | Admitting: Behavioral Health

## 2023-12-19 DIAGNOSIS — F330 Major depressive disorder, recurrent, mild: Secondary | ICD-10-CM

## 2023-12-19 DIAGNOSIS — F39 Unspecified mood [affective] disorder: Secondary | ICD-10-CM

## 2024-01-24 ENCOUNTER — Other Ambulatory Visit: Payer: Self-pay | Admitting: Behavioral Health

## 2024-01-24 DIAGNOSIS — F330 Major depressive disorder, recurrent, mild: Secondary | ICD-10-CM

## 2024-01-24 DIAGNOSIS — F39 Unspecified mood [affective] disorder: Secondary | ICD-10-CM

## 2024-03-17 ENCOUNTER — Telehealth: Payer: Self-pay | Admitting: Behavioral Health

## 2024-03-17 DIAGNOSIS — F39 Unspecified mood [affective] disorder: Secondary | ICD-10-CM

## 2024-03-17 DIAGNOSIS — F411 Generalized anxiety disorder: Secondary | ICD-10-CM

## 2024-03-17 DIAGNOSIS — F330 Major depressive disorder, recurrent, mild: Secondary | ICD-10-CM

## 2024-03-17 NOTE — Telephone Encounter (Signed)
 Needs refill of Cymbalta  and Lamictal   Appt 9/10   [facility] [address] [place]  [address]

## 2024-03-17 NOTE — Telephone Encounter (Signed)
 LVM to [facility]. Has not been compliant with FU or meds. Need to know how long he has been without meds.

## 2024-03-18 MED ORDER — DULOXETINE HCL 20 MG PO CPEP: 40.0000 mg | ORAL_CAPSULE | Freq: Every day | ORAL | 0 refills | Status: DC

## 2024-03-18 NOTE — Telephone Encounter (Signed)
 Cymbalta  sent to requested pharmacy. He responded to [facility] and reports he doesn't need Lamictal  currently.

## 2024-03-18 NOTE — Telephone Encounter (Signed)
 Pt contacted office. He is out of Cymbalta . He had a 40mg  RX that just ran out and he had already run out of the 20mg  prior. He's been taking 40mg  per day consistently. He does not need Lamictal  right now. Please send in to the [facility] in [place], [facility] [address] [place] - [address] AT

## 2024-03-18 NOTE — Telephone Encounter (Addendum)
 Left second voice mail to [facility]. Sent MyChart message as well.

## 2024-03-23 ENCOUNTER — Telehealth: Admitting: Behavioral Health

## 2024-04-01 ENCOUNTER — Encounter: Payer: Self-pay | Admitting: Behavioral Health

## 2024-04-01 ENCOUNTER — Telehealth: Admitting: Behavioral Health

## 2024-04-01 DIAGNOSIS — F39 Unspecified mood [affective] disorder: Secondary | ICD-10-CM | POA: Diagnosis not present

## 2024-04-01 DIAGNOSIS — F330 Major depressive disorder, recurrent, mild: Secondary | ICD-10-CM

## 2024-04-01 DIAGNOSIS — F411 Generalized anxiety disorder: Secondary | ICD-10-CM | POA: Diagnosis not present

## 2024-04-01 MED ORDER — LAMOTRIGINE 200 MG PO TABS: 200.0000 mg | ORAL_TABLET | Freq: Every day | ORAL | 1 refills | Status: AC

## 2024-04-01 MED ORDER — DULOXETINE HCL 20 MG PO CPEP: 40.0000 mg | ORAL_CAPSULE | Freq: Every day | ORAL | 1 refills | Status: AC

## 2024-04-01 NOTE — Progress Notes (Signed)
 Jorge Orozco [id number] 1971/07/04 53 y.o.  Virtual Visit via Video Note  I connected with pt @ on 04/01/24 at 10:30 AM EDT by a video enabled telemedicine application and verified that I am speaking with the correct person using two identifiers.   I discussed the limitations of evaluation and management by telemedicine and the availability of in person appointments. The patient expressed understanding and agreed to proceed.  I discussed the assessment and treatment plan with the patient. The patient was provided an opportunity to ask questions and all were answered. The patient agreed with the plan and demonstrated an understanding of the instructions.   The patient was advised to call back or seek an in-person evaluation if the symptoms worsen or if the condition fails to improve as anticipated.  I provided 20 minutes of non-face-to-face time during this encounter.  The patient was located at home.  The provider was located at [facility].   Jorge DELENA Pizza, NP   Subjective:   Patient ID:  Jorge Orozco is a 53 y.o. (DOB Oct 25, 1970) male.  Chief Complaint: No chief complaint on file.   HPI Jorge Orozco, 53 year old patient presents to this office via video visit for follow up and medication management. Collateral information should be considered reliable. Says he is doing very good right now.   He numerically rates his depression at 0/10, and anxiety at 2/10.  He says that he is sleeping 7 to 8 hours per night.  Says that he has been doing very well since last visit. Working on getting his 100 ton  [employer]. Has been working on [employer] down in [place].  He does not want to make any adjustments to his medication this visit.   He is currently seeing Candace Folden for psychotherapy.  He denies any history of mania, no psychosis, no auditory or visual hallucinations.  Denies any current SI or HI.  Patient states that he feels safe and verbally contracted for safety with  this Clinical research associate.   Past psychiatric medication trials: Zoloft Lexapro Trintellix- Did not like the way it made him feel Seroquel Klonopin Xanax Gabapentin Ambien Lunesta Lamictal  Wellbutrin Cymbalta - current     Review of Systems:  Review of Systems  Constitutional: Negative.   Allergic/Immunologic: Negative.   Neurological: Negative.   Psychiatric/Behavioral: Negative.      Medications: I have reviewed the patient's current medications.  Current Outpatient Medications  Medication Sig Dispense Refill   DULoxetine  (CYMBALTA ) 20 MG capsule Take 2 capsules (40 mg total) by mouth daily. 180 capsule 1   lamoTRIgine  (LAMICTAL ) 200 MG tablet Take 1 tablet (200 mg total) by mouth daily. 90 tablet 1   No current facility-administered medications for this visit.    Medication Side Effects: None  Allergies: No Known Allergies  Past Medical History:  Diagnosis Date   Asthma    Bipolar disorder (HCC)    Chronic knee pain 2012   right; baker's cyst   Insomnia    hard to get back asleep, sleep test in 2012 reportedly normal   Lump on neck    resolved   Microscopic hematuria 2012   workup suggested a passed kidney stone   Rib fracture 01/2012   July 2013 left 8th and 10th rib fractures from soccer accident, no penumothorax   Right knee pain    mild pain with exercise    Family History  Problem Relation Age of Onset   Depression Mother    Anxiety disorder Mother  Hypertension Mother    Sleep apnea Mother    Leukemia Father    CAD Father    Prostate cancer Father    Anxiety disorder Sister    Alcohol abuse Maternal Grandfather    Bipolar disorder Maternal Grandmother    Alcohol abuse Maternal Grandmother     Social History   Socioeconomic History   Marital status: Single    Spouse name: Not on file   Number of children: 0   Years of education: 16   Highest education level: Bachelor's degree (e.g., BA, AB, BS)  Occupational History   Occupation: [employer]  Tobacco Use   Smoking status: Never   Smokeless tobacco: Not on file  Vaping Use   Vaping status: Never Used  Substance and Sexual Activity   Alcohol use: Yes    Comment: Two drinks per day, glass of wine or coctail   Drug use: No   Sexual activity: Yes  Other Topics Concern   Not on file  Social History Narrative   Living in [facility] alone. Enjoys fishing, and boating in free time.    Social Drivers of Corporate investment banker Strain: Not on file  Food Insecurity: Not on file  Transportation Needs: Not on file  Physical Activity: Not on file  Stress: Not on file  Social Connections: Not on file  Intimate Partner Violence: Not on file    Past Medical History, Surgical history, Social history, and Family history were reviewed and updated as appropriate.   Please see review of systems for further details on the patient's review from today.   Objective:   Physical Exam:  There were no vitals taken for this visit.  Physical Exam Constitutional:      General: He is not in acute distress.    Appearance: Normal appearance.  Neurological:     Mental Status: He is alert and oriented to person, place, and time.     Gait: Gait normal.  Psychiatric:        Attention and Perception: Attention and perception normal. He does not perceive auditory or visual hallucinations.        Mood and Affect: Mood and affect normal. Mood is not anxious or depressed. Affect is not labile.        Speech: Speech normal.        Behavior: Behavior normal. Behavior is cooperative.        Thought Content: Thought content normal.        Cognition and Memory: Cognition and memory normal.        Judgment: Judgment normal.     Lab Review:  No results found for: NA, K, CL, CO2, GLUCOSE, BUN, CREATININE, CALCIUM, PROT, ALBUMIN, AST, ALT, ALKPHOS, BILITOT, GFRNONAA, GFRAA  No results found for: WBC, RBC, HGB, HCT, PLT,  MCV, MCH, MCHC, RDW, LYMPHSABS, MONOABS, EOSABS, BASOSABS  No results found for: POCLITH, LITHIUM   No results found for: PHENYTOIN, PHENOBARB, VALPROATE, CBMZ   .res Assessment: Plan:     Greater than 50% of  30 min video visit time with patient was spent on counseling and coordination of care. Discussed his report of good stability right now. He is happy with his medications.   Requesting no medication changes this visit.   We agreed today to: Will continue Cymbalta  40 mg daily after breakfast To continue Lamictal  200 mg daily To follow-up in 6 months to reassess via video appointment Will report worsening symptoms or side  effects promptly Provided emergency contact information Reviewed PDMP   Jorge A. Nikira Kushnir, NP      Diagnoses and all orders for this visit:  Unspecified mood (affective) disorder (HCC) -     lamoTRIgine  (LAMICTAL ) 200 MG tablet; Take 1 tablet (200 mg total) by mouth daily. -     DULoxetine  (CYMBALTA ) 20 MG capsule; Take 2 capsules (40 mg total) by mouth daily.  Mild episode of recurrent major depressive disorder (HCC) -     lamoTRIgine  (LAMICTAL ) 200 MG tablet; Take 1 tablet (200 mg total) by mouth daily. -     DULoxetine  (CYMBALTA ) 20 MG capsule; Take 2 capsules (40 mg total) by mouth daily.  Generalized anxiety disorder -     DULoxetine  (CYMBALTA ) 20 MG capsule; Take 2 capsules (40 mg total) by mouth daily.     Please see After Visit Summary for patient specific instructions.  No future appointments.  No orders of the defined types were placed in this encounter.     -------------------------------
